# Patient Record
Sex: Male | Born: 1940 | Marital: Married
Health system: Southern US, Community
[De-identification: ages and names within clinical notes are randomized; demographics above are authoritative.]

---

## 2004-03-19 ENCOUNTER — Emergency Department: Payer: Self-pay | Admitting: General Practice

## 2004-04-18 ENCOUNTER — Ambulatory Visit: Payer: Self-pay

## 2004-04-26 ENCOUNTER — Ambulatory Visit: Payer: Self-pay | Admitting: Surgery

## 2005-09-17 ENCOUNTER — Ambulatory Visit: Payer: Self-pay | Admitting: Family Medicine

## 2007-06-22 ENCOUNTER — Emergency Department: Payer: Self-pay | Admitting: Emergency Medicine

## 2007-09-30 ENCOUNTER — Ambulatory Visit: Payer: Self-pay | Admitting: Family Medicine

## 2011-04-04 ENCOUNTER — Ambulatory Visit: Payer: Self-pay | Admitting: Internal Medicine

## 2011-04-04 LAB — CBC CANCER CENTER
Basophil #: 0 x10 3/mm (ref 0.0–0.1)
Basophil %: 0.6 %
Eosinophil #: 0.1 x10 3/mm (ref 0.0–0.7)
Eosinophil %: 2.9 %
HCT: 43.4 % (ref 40.0–52.0)
HGB: 14.4 g/dL (ref 13.0–18.0)
Lymphocyte #: 1.7 x10 3/mm (ref 1.0–3.6)
Lymphocyte %: 41.9 %
MCH: 29.5 pg (ref 26.0–34.0)
MCV: 89 fL (ref 80–100)
Monocyte #: 0.2 x10 3/mm (ref 0.0–0.7)
Monocyte %: 4.9 %
Neutrophil #: 2.1 x10 3/mm (ref 1.4–6.5)
Neutrophil %: 49.7 %
Platelet: 127 x10 3/mm — ABNORMAL LOW (ref 150–440)
RDW: 14.6 % — ABNORMAL HIGH (ref 11.5–14.5)
WBC: 4.1 x10 3/mm (ref 3.8–10.6)

## 2011-04-04 LAB — RETICULOCYTES: Absolute Retic Count: 0.082 10*6/uL (ref 0.024–0.084)

## 2011-04-04 LAB — IRON AND TIBC
Iron Bind.Cap.(Total): 318 ug/dL (ref 250–450)
Iron: 93 ug/dL (ref 65–175)
Unbound Iron-Bind.Cap.: 225 ug/dL

## 2011-04-04 LAB — LACTATE DEHYDROGENASE: LDH: 180 U/L (ref 87–241)

## 2011-04-04 LAB — FERRITIN: Ferritin (ARMC): 220 ng/mL (ref 8–388)

## 2011-04-08 ENCOUNTER — Ambulatory Visit: Payer: Self-pay | Admitting: Internal Medicine

## 2011-04-08 LAB — PROT IMMUNOELECTROPHORES(ARMC)

## 2012-04-28 ENCOUNTER — Inpatient Hospital Stay: Payer: Self-pay | Admitting: Internal Medicine

## 2012-04-28 LAB — CBC WITH DIFFERENTIAL/PLATELET
Basophil #: 0 10*3/uL (ref 0.0–0.1)
Basophil %: 0.6 %
Eosinophil %: 0.9 %
HGB: 15 g/dL (ref 13.0–18.0)
Lymphocyte #: 1.2 10*3/uL (ref 1.0–3.6)
Lymphocyte %: 14.6 %
MCH: 28.5 pg (ref 26.0–34.0)
Monocyte #: 0.5 x10 3/mm (ref 0.2–1.0)
Monocyte %: 6.3 %
Neutrophil #: 6.3 10*3/uL (ref 1.4–6.5)
Neutrophil %: 77.6 %
Platelet: 128 10*3/uL — ABNORMAL LOW (ref 150–440)

## 2012-04-28 LAB — URINALYSIS, COMPLETE
Bacteria: NONE SEEN
Bilirubin,UR: NEGATIVE
Glucose,UR: NEGATIVE mg/dL (ref 0–75)
Ketone: NEGATIVE
Nitrite: NEGATIVE
RBC,UR: 7 /HPF (ref 0–5)
Specific Gravity: 1.018 (ref 1.003–1.030)
WBC UR: 8 /HPF (ref 0–5)

## 2012-04-28 LAB — COMPREHENSIVE METABOLIC PANEL
Anion Gap: 5 — ABNORMAL LOW (ref 7–16)
Bilirubin,Total: 0.5 mg/dL (ref 0.2–1.0)
Calcium, Total: 9.1 mg/dL (ref 8.5–10.1)
Chloride: 102 mmol/L (ref 98–107)
Creatinine: 0.85 mg/dL (ref 0.60–1.30)
EGFR (African American): >60
EGFR (Non-African Amer.): >60
Glucose: 148 mg/dL — ABNORMAL HIGH (ref 65–99)
Osmolality: 274 (ref 275–301)
SGOT(AST): 48 U/L — ABNORMAL HIGH (ref 15–37)
SGPT (ALT): 25 U/L (ref 12–78)
Sodium: 135 mmol/L — ABNORMAL LOW (ref 136–145)
Total Protein: 7.3 g/dL (ref 6.4–8.2)

## 2012-04-28 LAB — CK TOTAL AND CKMB (NOT AT ARMC)
CK, Total: 1127 U/L — ABNORMAL HIGH (ref 35–232)
CK-MB: 9.9 ng/mL — ABNORMAL HIGH (ref 0.5–3.6)

## 2012-04-29 DIAGNOSIS — R7989 Other specified abnormal findings of blood chemistry: Secondary | ICD-10-CM

## 2012-04-29 DIAGNOSIS — I219 Acute myocardial infarction, unspecified: Secondary | ICD-10-CM

## 2012-04-29 LAB — LIPID PANEL
Cholesterol: 134 mg/dL (ref 0–200)
Ldl Cholesterol, Calc: 75 mg/dL (ref 0–100)
Triglycerides: 49 mg/dL (ref 0–200)
VLDL Cholesterol, Calc: 10 mg/dL (ref 5–40)

## 2012-04-29 LAB — HEMOGLOBIN A1C: Hemoglobin A1C: 6.3 % (ref 4.2–6.3)

## 2012-04-29 LAB — BASIC METABOLIC PANEL
Anion Gap: 7 (ref 7–16)
BUN: 15 mg/dL (ref 7–18)
Calcium, Total: 8.8 mg/dL (ref 8.5–10.1)
Co2: 25 mmol/L (ref 21–32)
Creatinine: 0.74 mg/dL (ref 0.60–1.30)
EGFR (Non-African Amer.): >60
Glucose: 118 mg/dL — ABNORMAL HIGH (ref 65–99)
Potassium: 3.6 mmol/L (ref 3.5–5.1)

## 2012-04-29 LAB — TROPONIN I: Troponin-I: 0.59 ng/mL — ABNORMAL HIGH

## 2012-04-29 LAB — CK TOTAL AND CKMB (NOT AT ARMC): CK-MB: 8.4 ng/mL — ABNORMAL HIGH (ref 0.5–3.6)

## 2012-04-29 LAB — CK: CK, Total: 1091 U/L — ABNORMAL HIGH (ref 35–232)

## 2012-04-29 LAB — MAGNESIUM: Magnesium: 1.4 mg/dL — ABNORMAL LOW

## 2012-04-30 ENCOUNTER — Ambulatory Visit: Payer: Self-pay | Admitting: Internal Medicine

## 2012-05-07 ENCOUNTER — Ambulatory Visit: Payer: Self-pay | Admitting: Internal Medicine

## 2012-06-07 DEATH — deceased

## 2014-04-29 NOTE — Discharge Summary (Signed)
PATIENT NAME:  Jason EspyJACOBS, Zyere MR#:  [id number] DATE OF BIRTH:  [birth year]  DATE OF ADMISSION:  04/28/2012 DATE OF DISCHARGE:  05/01/2012  ADMISSION DIAGNOSIS: Encephalopathy.   DISCHARGE DIAGNOSES:  1.  Acute encephalopathy, suspected medication versus of worsening of his underlying severe dementia.  2.  Elevated cardiac enzymes.  3.  Alzheimer dementia.  4.  Hypomagnesemia.  5.  Hyponatremia.   CONSULTATIONS:   1.  Dr. Toni Amendlapacs of psychiatry.  2.  Dr. Julien Nordmannimothy Gollan.  3.  Hospice.   LABORATORY AND RADIOLOGICAL DATA: Discharge magnesium 1.8, sodium 136, potassium 3.6, chloride 104, bicarbonate 25, BUN 15, creatinine 0.74, glucose 118.   A 2D echocardiogram showed EF of 50% to 55%, unable to exclude regional wall motion abnormalities.   Troponin max was 0.64, 0.59 at discharge.   HOSPITAL COURSE: A 74 year old male with severe dementia who presented with encephalopathy. For further details, please refer to the H and P. 1.  Acute encephalopathy: Initially this was suspected to be medication-induced, as the family endorses that his encephalopathy started when the patient was started on Haldol. However, I suspect that it is a combination of both the Haldol as well as just progressive dementia. We appreciate palliative care consult. The patient has not had any improvement in his mental status over the 4 days that he was in the hospital. He opens his eyes and is nonverbal at baseline. He is not eating as well, again I suspect due to his underlying dementia. The patient was referred to hospice, and the family did decide on home hospice. The patient will be discharged with home hospice. His initial CPK was slightly elevated. There was suspicion for neuroleptic malignant syndrome. However, after consultation with psychiatry, this is felt unlikely due to the CPK not being dramatically high and no hyperthermia.  2.  Elevated cardiac enzymes: Appreciate cardiology consult. This was due to malnutrition  and demand ischemia from his malnutrition. His echo was poor quality; however, there is no suspected acute coronary syndrome.  3.  Alzheimer dementia: The patient will continue his outpatient medications as tolerated.   DISCHARGE MEDICATIONS:  1.  Amantadine 10 mL b.i.d. 2.  Morphine 0.5 mL q.4 hours p.r.n. pain/agitation.  3.  Ativan 0.5 mg t.i.d. p.r.n. agitation.  DISCHARGE DIET: Regular as tolerated, mechanical soft.  DISCHARGE REFERRAL: Hospice.   DISCHARGE FOLLOWUP: The patient will follow up with hospice.   PROGNOSIS: The patient has guarded prognosis but is ready for discharge.   TIME SPENT: 35 minutes.   ____________________________ Janyth ContesSital P. Juliene PinaMody, MD spm:jm D: 05/01/2012 14:15:03 ET T: 05/01/2012 20:51:05 ET JOB#: [id number]  cc: Crawford Tamura P. Juliene PinaMody, MD, <Dictator> Janyth ContesSITAL P Fayth Trefry MD ELECTRONICALLY SIGNED 05/09/2012 13:24

## 2014-04-29 NOTE — H&P (Signed)
PATIENT NAME:  Jason Silva, Jason Silva MR#:  [id number] DATE OF BIRTH:  1940-12-16  DATE OF ADMISSION:  04/28/2012  PRIMARY CARE PHYSICIAN:  Dr. Angelique Holm   REFERRING PHYSICIAN:  Dr. Clemens Catholic   CHIEF COMPLAINT: Skin tear and blister today.   HISTORY OF PRESENT ILLNESS: The patient is a 74 year old African American male with a history of Alzheimer's dementia, sacral decubitus ulcer, was sent to the ED from home by his wife due to above chief complaint. The patient is severely demented, nonverbal and noncommunicative. According to the patient's wife, the patient was noted to have skin blister and a tear today on the left arm and buttock. In addition, the patient's wife mentioned that the patient has muscle rigidity for the past 2 weeks. The patient was started with Haldol p.r.n. for agitation 2 weeks ago, but since that time the patient's muscles became rigid and  he became bedbound. The patient's CK was noted to be elevated at 1127, troponin at 0.52. Was suspected to have NMS. Dr. Clemens Catholic, the ED physician, admitted patient for supportive care.   PAST MEDICAL HISTORY: Alzheimer's dementia, sacral decubitus ulcer.   SOCIAL HISTORY: The patient living with his wife. No smoking, no drinking or illicit drugs.   FAMILY HISTORY: Unknown.   PAST SURGICAL HISTORY: No.   ALLERGIES: No.  HOME MEDICATIONS: Metolazone 2.5 mg p.o. daily, Lasix 40 mg p.o. 2 tablets once a day, Klor-Con M 20 oral tablet 1 tablet 3 times daily, haloperidol 2 mg/mL 1 mL every 4 hours p.r.n., aspirin 81 mg p.o. daily, amantadine 50 mg/5 mL 10 mL twice a day.   REVIEW OF SYSTEMS: Unable to obtain due to patient's dementia status.   PHYSICAL EXAMINATION: VITAL SIGNS: Temperature 98.7, blood pressure 141/84, pulse 73, respiration 18, oxygen saturation 99% on room air.  GENERAL: This patient is awake, but demented, unable to communicate, in no acute distress.  HEENT: Pupils are round, pinpoint, about 1 mm in diameter, not reactive  to light. No discharge from ear or nose and unable to do oral exam.  NECK: Supple. No JVD or carotid bruits. No lymphadenopathy. No thyromegaly.  CARDIOVASCULAR: S1, S2. Regular rate and rhythm. No murmurs or gallop.  PULMONARY: Bilateral air entry. No wheezing or rales. No use of accessory muscles to breathe.  ABDOMEN: Soft. No distention and no organomegaly. Bowel sounds present.  EXTREMITIES: No edema, clubbing or cyanosis. No calf tenderness. Strong bilateral pedal pulses.  SKIN: No rash or jaundice. There is a skin tear on the upper part of left arm, about 2 cm in length. There is another skin tear on the left side of buttock, about 2 cm long.  No bleeding. Sacral decubitus ulcer, stage II.    NEUROLOGY: Unable to examine due to patient's dementia status.   LABORATORY, DIAGNOSTIC AND RADIOLGIC DATA:  Glucose 184, BUN 16, creatinine 0.85, sodium 135, potassium 4.4, chloride 102, bicarbonate 28, CK 1127, CK-MB 9.9. Troponin 0.52.  EKG showed normal sinus rhythm at 90 beats per minute with prolonged QT.   IMPRESSION: 1.  Non-ST segment elevation myocardial infarction. 2.  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  3.  Hyponatremia.  4.  Thrombocytopenia.  5.  Alzheimer's dementia.  6.  Sacral decubitus ulcer, stage II.   PLAN OF TREATMENT:  1.  The patient will be admitted to telemetry floor. We will follow up troponin level. We will start aspirin 325 mg p.o. daily. Give Lovenox 1 mg/kg q.12 hours and start Lipitor and Lopressor. In addition, we will get  a cardiology consult.  2.  For possible NMS, we will hold haloperidol and start normal saline IV. Follow up with CK,  BMP, and a CBC.  3.  We will get a swallowing study.  4.  We will get wound care for sacral decubitus ulcer.  5.  Gastrointestinal and deep vein thrombosis prophylaxis.   Discussed the patient's condition and the plan of treatment with the patient, wife, and other family member. Also, I asked about patient's CODE STATUS.  Initially, patient's wife wants the patient to be placed on DO NOT RESUSCITATE status, but since other member does not agree, so patient's wife decided that the patient IS FULL CODE NOW and they will discuss with each other and then decide whether patient should be placed on DO NOT RESUSCITATE status.   TIME SPENT: About 57 minutes.    ____________________________ Shaune PollackQing Cristofher Livecchi, MD qc:cc D: 04/28/2012 15:49:33 ET T: 04/28/2012 16:56:33 ET JOB#: [id number]  cc: Shaune PollackQing Sukhman Martine, MD, <Dictator> Shaune PollackQING Flynn Lininger MD ELECTRONICALLY SIGNED 04/28/2012 19:57

## 2014-04-29 NOTE — Consult Note (Signed)
PATIENT NAME:  Jason Silva, Jason MR#:  [id number] DATE OF BIRTH:  [birth year]  DATE OF CONSULTATION:  04/29/2012  REFERRING PHYSICIAN:   CONSULTING PHYSICIAN:  Audery AmelJohn T. Woodrow Dulski, MD  IDENTIFYING INFORMATION AND REASON FOR CONSULT: The patient is a 74 year old man with a history of dementia, new presentation, with stiffness. Consultation for possible neuroleptic malignant syndrome.   HISTORY OF PRESENT ILLNESS: Information obtained from the family and from the chart. The patient is not able to talk or give any history. Apparently the patient had a new onset of stiffness and of skin problems in the last week or so. The chart indicates that the stiffness was a major concern, although when I spoke to the patient's family in the room they stated that their main concern about him was that his skin was peeling. The patient appears to have been severely demented with minimal responsiveness for quite some time. Apparently he had been prescribed haloperidol for treatment of agitation related to dementia. The patient's family do point out to me that his limbs have been stiff recently, more noticeable than usual. It has been more difficult to physically manipulate him. They do not report to me any other specific symptoms at this point. As I mentioned, the patient is not able to give any history.   PAST PSYCHIATRIC HISTORY: The patient has been identified as demented for quite a while. Does not appear to have any other relevant mental health history.   PAST MEDICAL HISTORY: History of dyslipidemia, hypertension, gastric reflux symptoms and advanced dementia.   SOCIAL HISTORY: The patient is evidently living at home with his wife.   REVIEW OF SYSTEMS: The patient is not able to offer any history. Chief complaints from the family are skin peeling, bruising and increased stiffness.   MENTAL STATUS EXAMINATION: The patient is nonresponsive. His eyes are open. Difficult to assess whether or not he is awake. Seems to have  very advanced dementia. On evaluation of his upper arms, I found that both of them were stiff. There was not lead-pipe level rigidity. I was able to bend his arms at the shoulder and at the elbow and the wrist without any fear that I was injuring him or using excessive force; however, they are stiff.  Limbs tends to bounce back to their previous position after being bent in a slow manner. I was not able to detect any cogwheel rigidity.   PHYSICAL EXAMINATION:  Most notable here for his vital signs. Temperature has been normal throughout his hospital stay. Pulse peaked at 109, otherwise has been normal. Blood pressure has been in the normal range.   ASSESSMENT: A 74 year old man who does present with stiffness, also some kind of skin peeling, although I did not appreciate this on my exam with him. Recent use of haloperidol.  The patient currently is afebrile, does not have a significantly labile blood pressure or pulse.  He is rigid but does not have lead-pipe rigidity. On review of lab tests, his white blood cell count was normal. He was not showing signs of acute renal failure. CK peaked at 1336.  All and all this is not a picture typical of neuroleptic malignant syndrome. There is not a single gold standard diagnosis for neuroleptic malignant syndrome, which can present as a cluster symptoms, however hyperthermia is almost universal. Other almost universal features are unstable and labile blood pressure and pulse, profound sweating, lead-pipe rigidity and elevated white blood cell count. The patient really has none of these. He does have an  elevated CK, but I see that he has been given a diagnosis of a myocardial infarction. His CK peaked at 1336.  Descriptions of neuroleptic malignant syndrome usually states that 1000 is about the minimum that you will see and often you see an order of magnitude above that for neuroleptic malignant syndrome. It is possible that he could be having a dystonic reaction from the  haloperidol. Although his presentation does not look very typical for that, it is hard to tell without him being able to give me history. I do not appreciate any typical ratcheting to his stiffness.   RECOMMENDATIONS:  I doubt that this is neuroleptic malignant syndrome. If it is, it is extraordinarily mild neuroleptic malignant syndrome. The primary treatment for neuroleptic malignant syndrome is discontinuing antipsychotics and supportive therapy. Other medications such as dantrolene are experimental and are usually reserved for very severe cases. They have risks of their own side effects with them.  I do not think that his current condition would suggest doing any of that. I would discontinue antipsychotics. If he were having a typical extrapyramidal reaction to Haldol, the treatment would be Benadryl or Cogentin typically. However, if he were having that sort of reaction you would expect that it would go away in a pretty short period of time after stopping the neuroleptics anyway. Those other medications have their own risks including making confusion and dementia worse. Overall, I do not think I would suggest adding any medications, but I would discontinue dopamine blocking medicines.   DIAGNOSIS, PRINCIPAL AND PRIMARY:   AXIS I: Dementia, severe, presumably Alzheimer's type with vascular as well.   SECONDARY DIAGNOSES:  AXIS II: No diagnosis.   AXIS III: Myocardial infarction, hypertension.   AXIS IV: Hard to assess stress in somebody this is disabled in the first place.   AXIS V: Functioning at time of evaluation 10.   ____________________________ Audery Amel, MD jtc:sb D: 04/30/2012 12:38:00 ET T: 04/30/2012 12:59:11 ET JOB#: [id number]  cc: Audery Amel, MD, <Dictator> Audery Amel MD ELECTRONICALLY SIGNED 04/30/2012 15:09

## 2014-04-29 NOTE — Consult Note (Signed)
Brief Consult Note: Diagnosis: dementia alzheimers type severe.   Patient was seen by consultant.   Consult note dictated.   Comments: Psychiatry: Patient seen and examined. Chart reviewed.Concern about NMS. Patient is stiff but not lead pipe stiff. Not febrile. Slightly tachycardic but BP stable. WBC nml. CPK up around 1000 but pt also has dx of MI. NMS almost always involves severe hyperthermia. CPK usually much >1000. There is no gold standard for NMS dx but the overall picture here does not look like NMS. Nevertheless I agree with holding anti-domiminergic meds. Tx for NMS is largely supportive with meds of unclear benefit and usually reserved for severe and clear-cut cases. Would not advise use of any specific meds to treat pt for NMS.  Electronic Signatures: Mizraim Harmening, Jackquline DenmarkJohn T (MD)  (Signed 23-Apr-14 14:27)  Authored: Brief Consult Note   Last Updated: 23-Apr-14 14:27 by Audery Amellapacs, Monta Maiorana T (MD)

## 2014-04-29 NOTE — Consult Note (Signed)
Comments   Dr Phifer and I met with pt's wife and daughter. Updated family on pt's status. They recognize that he may not recover from this illness. We also talked about pt's inability to consume oral nutrition in his current state. Pt has a living will, and per family, pt would not want to be sustained in a futile state. Wife is not interested in artificial feeding. Family says that patient should be a DNR per his wishes. We talked about hospice services and wife wants to take patient home with hospice. Will order screening.  DNRHospice screening 20 minutes  Electronic Signatures for Addendum Section:  Phifer, Nancy (MD) (Signed Addendum 24-Apr-14 19:40)  Josh Borders, NP, and I met with pt's wife and daughter. Agree with assessment and plan as outlined in above note. Family wants pt to return home. They understand that he is in the late stages of Alzheimers and are agreeable with home hospice. Pt is a DNR.   Electronic Signatures: Borders, Joshua R (NP)  (Signed 24-Apr-14 15:03)  Authored: Palliative Care   Last Updated: 24-Apr-14 19:40 by Phifer, Nancy (MD) 

## 2014-04-29 NOTE — Consult Note (Signed)
General Aspect Jason Silva is a 74 yo African-American male with no prior cardiac history, PMHx s/f Alzheimer's dementia and sacral decubitus ulcers from bedbound status who was admitted to [facility] yesterday for skin tear and two week history of rigidity/spasticity. Cardiology was consulted for elevated cardiac enz.  The patient is demented, somnolent and noncommunicative. The history was provided by his brother and sister in the room. He has had no prior cardiac issues- no MI/cath/stress tests. No family history of CAD. 20+ pack-year tob history, quit 10 years ago. No recent complaints of chest pain or shortness of breath. They have noticed increased leg swelling. No PND, orthopnea, palpitations or syncope. He is able to walk and feed himself at baseline. Due to Alzheimer's, he does not speak. Over the past two weeks, his energy level and appetite have declined. He has been bedbound and more rigid. He was apparently recently started on Haldol for agitation. He developed a left arm and buttock skin tear, and was transported to the ED.   Present Illness There, EKG revealed no evidence of ischemia. Initial trop-I mildly elevated at 0.52. CK 1127, CK-MB 9.9. BMET unremarkable. LFT- AST 48, AP 140. CBC- PLT 128, otherwise normal. U/a- 2+ LEs. Noncon head CT- increased cerebral atrophy, ventriculomegaly, no acute process. CXR w/o acute disease. 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 Haldol has been held. Two subsequent readings of trop-I returned at 0.64->0.59. He is being hydrated- CK remaining stable.  PAST MEDICAL HISTORY: Alzheimer's dementia, sacral decubitus ulcer.   SOCIAL HISTORY: The patient living with his wife. 20+ pack-year tobacco history, quit 10 years ago. No drinking or illicit drugs.   FAMILY HISTORY: Unknown.   PAST SURGICAL HISTORY: No.   ALLERGIES: No.   Physical Exam:  GEN no acute distress, cachectic   HEENT red conjunctivae   NECK supple  No masses  trachea  midline   RESP normal resp effort  clear BS  no use of accessory muscles   CARD Regular rate and rhythm  Normal, S1, S2  No murmur   ABD denies tenderness  soft  normal BS   EXTR negative cyanosis/clubbing, trace bilateral pretibial edema   SKIN normal to palpation   NEURO spastic, unable to assess motor/sensory function or strength   PSYCH stuporous   Review of Systems:  Subjective/Chief Complaint rigidity   General: Weight loss or gain   Respiratory: No Complaints   Cardiovascular: No Complaints   ROS Pt not able to provide ROS  ROS limited due to stuporous mentation, provided by relatives     Alzheimer's Disease:    Denies medical history:    Denies surgical history.:     Aspirin Enteric Coated tablet, ( Ecotrin)  325 mg Oral daily  - Indication: Pain/Fever/Thromboembolic Disorders/Post MI/Prophylaxis MI  Instructions:  Initiate Bleeding Precautions Protocol--DO NOT CRUSH, 28-Apr-2012, Active, Standard   atorvaSTATin tablet, 20 mg Oral at bedtime  - Indication: Hypercholesterolemia, 28-Apr-2012, Active, Standard   Enoxaparin injection, ( Lovenox injection )  75 mg, Subcutaneous, q12h  Indication: Prophylaxis or treatment of thromboembolic disorders, Monitor Anticoags per hospital protocol, 28-Apr-2012, Active, Standard   meTOProlol tartrate tablet, ( Lopressor)  25 mg Oral q6h  - Indication: Antihyperensive/ Angina  Instructions:  HOLD if HR <50, SBP <100, 28-Apr-2012, Active, Standard   Nitroglycerin tablet, ( Nitrostat SL)  0.4 mg Sublingual Q5M PRN for chest pain  - Indication: Angina/ Hypertension  Instructions:  q 5 minutes x 3 doses PRN, 28-Apr-2012, Active,  Standard   Acetaminophen * tablet, ( Tylenol (325 mg) tablet)  650 mg Oral q4h PRN for pain or temp. greater than 100.4  - Indication: Pain/Fever, 28-Apr-2012, Active, Standard   MorphINE  injection, 2 to 4 mg, IV push, q4h PRN for pain  Indication: Pain, [Med Admin Window: 30 mins before or  after scheduled dose], 28-Apr-2012, Active, Standard   Ondansetron injection, ( Zofran injection )  4 mg, IV push, q4h PRN for Nausea/Vomiting  Indication: Nausea/ Vomiting, 28-Apr-2012, Active, Standard   Pantoprazole tablet, 40 mg Oral q6am  - Indication: Erosive Esophagitis/ GERD  Instructions:  DO NOT CRUSH, 28-Apr-2012, Active, Standard   Amantadine 50 mg/5 ml syrup, 100 mg Oral q12h  -Indication:Anti-Parkinsons/ Anti-viral, Excessive bleeding, 28-Apr-2012, Active, Standard   Metolazone tablet, ( Zaroxolyn)  2.5 mg Oral daily  - Indication: Antihyperensive/ Diuretic, 28-Apr-2012, Active, Standard   Pneumococcal 23-valent Vaccine, 0.5 ml, Intramuscular, atdischarge  Indication: Pneumococcal Immunization, 0.41m IM once (Stored in [facility], 28-Apr-2012, Active, Standard   Furosemide injection,  ( Lasix injection )  40 mg, IV push, once  Indication: Diuresis, 29-Apr-2012, Completed, Standard   Magnesium Sulfate injection, 2 gram in Sterile Water 50 ml, IV Piggyback, once, Infuse over 240 minute(s)  Indication: Magnesium Deficiency/ Pre-Eclampsia/ Eclampsia, 29-Apr-2012, Completed, Standard  Home Medications: Medication Instructions Status  Klor-Con M20 oral tablet, extended release 1 tab(s) orally 3 times a day Active  metolazone 2.5 mg oral tablet 1 tab(s) orally once a day Active  Lasix 40 mg oral tablet 2 tab(s) orally once a day Active  aspirin 81 mg oral tablet 1 tab(s) orally once a day Active  haloperidol 2 mg/mL oral concentrate 1 milliliter(s) orally every 4 hours, As Needed Active  amantadine 50 mg/5 mL oral syrup 10 milliliter(s) orally 2 times a day Active   Lab Results:  Hepatic:  22-Apr-14 11:58   Bilirubin, Total 0.5  Alkaline Phosphatase  140  SGPT (ALT) 25  SGOT (AST)  48  Total Protein, Serum 7.3  Albumin, Serum  3.3  Cardiology:  22-Apr-14 11:33   Ventricular Rate 90  Atrial Rate 90  P-R Interval 170  QRS Duration 94  QT 412  QTc 504   P Axis 63  R Axis 61  T Axis 62  ECG interpretation Normal sinus rhythm Prolonged QT Abnormal ECG When compared with ECG of 18-Apr-2004 13:17, Vent. rate has increased BY  41 BPM QT has lengthened ----------unconfirmed---------- Confirmed by OVERREAD, NOT ([id number]), editor PEARSON, BARBARA ([id number] on 04/28/2012 1:33:56 PM  Routine Chem:  22-Apr-14 11:58   Result Comment TOTAL CK - RESULT OBTAINED BY DILUTION  Result(s) reported on 28 Apr 2012 at 02:15PM.  Result Comment TROPONIN - RESULTS VERIFIED BY REPEAT TESTING.  - C/LUNA [id number]  - READ-BACK PROCESS PERFORMED.  Result(s) reported on 28 Apr 2012 at 02:21PM.  Glucose, Serum  148  BUN 16  Creatinine (comp) 0.85  Sodium, Serum  135  Potassium, Serum 4.4  Chloride, Serum 102  CO2, Serum 28  Calcium (Total), Serum 9.1  Anion Gap  5  Osmolality (calc) 274  eGFR (African American) >60  eGFR (Non-African American) >60 (eGFR values <644mmin/1.73 m2 may be an indication of chronic kidney disease (CKD). Calculated eGFR is useful in patients with stable renal function. The eGFR calculation will not be reliable in acutely ill patients when serum creatinine is changing rapidly. It is not useful in  patients on dialysis. The eGFR calculation may not be applicable to patients at the low and  high extremes of body sizes, pregnant women, and vegetarians.)    18:48   Result Comment troponin - RESULTS VERIFIED BY REPEAT TESTING.  - previously called '@14' :15 04-28-12 by VKB  - LJW  Result(s) reported on 28 Apr 2012 at 08:15PM.  Result Comment CK-MB - CANCELED DUPLICATE TEST MPG 3/81/77  Result(s) reported on 28 Apr 2012 at 07:36PM.  23-Apr-14 04:03   Result Comment TROPONIN - RESULTS VERIFIED BY REPEAT TESTING.  - PREV CALLED 4/22/14AT [id number]  Result(s) reported on 29 Apr 2012 at 05:28AM.  Glucose, Serum  118  BUN 15  Creatinine (comp) 0.74  Sodium, Serum 136  Potassium, Serum 3.6  Chloride, Serum 104  CO2,  Serum 25  Calcium (Total), Serum 8.8  Anion Gap 7  Osmolality (calc) 274  eGFR (African American) >60  eGFR (Non-African American) >60 (eGFR values <38m/min/1.73 m2 may be an indication of chronic kidney disease (CKD). Calculated eGFR is useful in patients with stable renal function. The eGFR calculation will not be reliable in acutely ill patients when serum creatinine is changing rapidly. It is not useful in  patients on dialysis. The eGFR calculation may not be applicable to patients at the low and high extremes of body sizes, pregnant women, and vegetarians.)  Magnesium, Serum  1.4 (1.8-2.4 THERAPEUTIC RANGE: 4-7 mg/dL TOXIC: > 10 mg/dL  -----------------------)  Hemoglobin A1c ([facility]) 6.3 (The American Diabetes Association recommends that a primary goal of therapy should be <7% and that physicians should reevaluate the treatment regimen in patients with HbA1c values consistently >8%.)  Cholesterol, Serum 134  Triglycerides, Serum 49  HDL (INHOUSE) 49  VLDL Cholesterol Calculated 10  LDL Cholesterol Calculated 75 (Result(s) reported on 29 Apr 2012 at 05:23AM.)  Cardiac:  22-Apr-14 11:58   Troponin I  0.52 (0.00-0.05 0.05 ng/mL or less: NEGATIVE  Repeat testing in 3-6 hrs  if clinically indicated. >0.05 ng/mL: POTENTIAL  MYOCARDIAL INJURY. Repeat  testing in 3-6 hrs if  clinically indicated. NOTE: An increase or decrease  of 30% or more on serial  testing suggests a  clinically important change)  CK, Total  1127  CPK-MB, Serum  9.9    18:48   Troponin I  0.64 (0.00-0.05 0.05 ng/mL or less: NEGATIVE  Repeat testing in 3-6 hrs  if clinically indicated. >0.05 ng/mL: POTENTIAL  MYOCARDIAL INJURY. Repeat  testing in 3-6 hrs if  clinically indicated. NOTE: An increase or decrease  of 30% or more on serial  testing suggests a  clinically important change)  CK, Total  1336  CPK-MB, Serum  12.1 (Result(s) reported on 28 Apr 2012 at 08:15PM.)  CPK-MB, Serum -   23-Apr-14 04:03   Troponin I  0.59 (0.00-0.05 0.05 ng/mL or less: NEGATIVE  Repeat testing in 3-6 hrs  if clinically indicated. >0.05 ng/mL: POTENTIAL  MYOCARDIAL INJURY. Repeat  testing in 3-6 hrs if  clinically indicated. NOTE: An increase or decrease  of 30% or more on serial  testing suggests a  clinically important change)  CK, Total  1120  CK, Total  1091 (Result(s) reported on 29 Apr 2012 at 0[facility])  CPK-MB, Serum  8.4 (Result(s) reported on 29 Apr 2012 at 05:28AM.)  Routine UA:  22-Apr-14 14:46   Color (UA) Yellow  Clarity (UA) Hazy  Glucose (UA) Negative  Bilirubin (UA) Negative  Ketones (UA) Negative  Specific Gravity (UA) 1.018  Blood (UA) 1+  pH (UA) 5.0  Protein (UA) Negative  Nitrite (UA) Negative  Leukocyte Esterase (UA) 2+ (Result(s)  reported on 28 Apr 2012 at 03:01PM.)  RBC (UA) 7 /HPF  WBC (UA) 8 /HPF  Bacteria (UA) NONE SEEN  Epithelial Cells (UA) 1 /HPF  Mucous (UA) PRESENT (Result(s) reported on 28 Apr 2012 at 03:01PM.)  Routine Hem:  22-Apr-14 11:58   WBC (CBC) 8.1  RBC (CBC) 5.26  Hemoglobin (CBC) 15.0  Hematocrit (CBC) 46.1  Platelet Count (CBC)  128  MCV 88  MCH 28.5  MCHC 32.4  RDW 13.6  Neutrophil % 77.6  Lymphocyte % 14.6  Monocyte % 6.3  Eosinophil % 0.9  Basophil % 0.6  Neutrophil # 6.3  Lymphocyte # 1.2  Monocyte # 0.5  Eosinophil # 0.1  Basophil # 0.0 (Result(s) reported on 28 Apr 2012 at 01:53PM.)   EKG:  Interpretation EKG shows NSR, no ST/T changes, good R wave progression   Rate 90   Additional Comments No prior tracing for comparison   Radiology Results:  XRay:    23-Apr-14 03:37, Chest Portable Single View  Chest Portable Single View   REASON FOR EXAM:    Dyspnea  COMMENTS:       PROCEDURE: DXR - DXR PORTABLE CHEST SINGLE VIEW  - Apr 29 2012  3:37AM     RESULT: Comparison: None    Findings:     Single portable AP chest radiograph is provided.  There is no focal   parenchymal opacity, pleural  effusion, or pneumothorax. Normal   cardiomediastinal silhouette. The osseous structures are unremarkable.    IMPRESSION:   No acute disease of the chest.    Dictation Site: 1        Verified By: Jennette Banker, M.D., MD    No Known Allergies:   Vital Signs/Nurse's Notes:  **Vital Signs.:   23-Apr-14 07:50  Temperature Temperature (F) 97.8  Celsius 36.5  Pulse Pulse 101  Respirations Respirations 18  Systolic BP Systolic BP 846  Diastolic BP (mmHg) Diastolic BP (mmHg) 81  Mean BP 93  Pulse Ox % Pulse Ox % 94  Oxygen Delivery Room Air/ 21 %  *Intake and Output.:   23-Apr-14 01:51  Grand Totals Intake:  0 Output:  0    Net:  0 24 Hr.:  0  Unmeasured Output  Void; incontinent    04:22  Grand Totals Intake:  0 Output:  0    Net:  0 24 Hr.:  0  Weight Type daily  Weight Method Bed  Current Weight (lbs) (lbs) 161  Current Weight (kg) (kg) 73  Height Type actual  Height (ft) (feet) 6  Height (in) (in) 2  Height (cm) centimeters 187.9  BSA (m2) 1.9  BMI (kg/m2) 20.6    07:00  Grand Totals Intake:  0 Output:  0    Net:  0 24 Hr.:  0  Oral Intake      In:  0    07:20  Grand Totals Intake:  0 Output:  0    Net:  0 24 Hr.:  0  Urinary Method  Void; Incontinent; lg. amount    Impression Jason Silva is a 74 yo African-American male with no prior cardiac history, PMHx s/f Alzheimer's dementia and sacral decubitus ulcers from bedbound status who was admitted to [facility] yesterday for skin tear and two week history of rigidity/spasticity.  1. Elevated troponin In the setting of #2/3. Serial troponins have returned mildly elevated, and plateued. No prior cardiac history. No recent endorsement of chest discomfort or shortness of breath, however, family admits given  his baseline severe dementia, it is difficult to determine new onset of symptoms. EKG is essentially normal.  Suspect troponemia may be related to myopathy with elevated CK on admission. Could be a component of  CHF (however fairly euvolemic on exam) or demand ischemia in the setting of acute illness/skin tears. He has had some bilateral LE swelling at home. Currently with no edema. Not dyspneic, tachycardic or hypoxic.  -- Hold on further ischemic eval -- Will order 2D echo -- D/c ACS dose Lovenox, change to DVT prophy anticoag  2. 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  -- Haldol being held by the medicine team.  -- Continue IVF hydration for myopathy  3. Malnutrition Cachectic on exam. Weight loss endorsed by family.  -- Consider nutrition consult  4. Hyponatremia, mild 5. Thrombocytopenia 6. Alzheimer's dementia 7. Skin tears Futher management per primary team.   Electronic Signatures: Tiasia Weberg A (PA-C)  (Signed 23-Apr-14 10:24)  Authored: General Aspect/Present Illness, History and Physical Exam, Review of System, Past Medical History, Orders, Home Medications, Labs, EKG , Radiology, Allergies, Vital Signs/Nurse's Notes, Impression/Plan Ida Rogue (MD)  (Signed 23-Apr-14 16:51)  Authored: General Aspect/Present Illness, EKG , Radiology, Vital Signs/Nurse's Notes, Impression/Plan  Co-Signer: General Aspect/Present Illness, History and Physical Exam, Review of System, Past Medical History, Orders, Home Medications, Labs, EKG , Radiology, Allergies, Vital Signs/Nurse's Notes, Impression/Plan   Last Updated: 23-Apr-14 16:51 by Ida Rogue (MD)

## 2014-11-19 IMAGING — CT CT HEAD WITHOUT CONTRAST
1 series · 15 of 30 positions shown, 19 images · non-contrast
Comparison: none

REASON FOR EXAM: not walking
COMMENTS:

[Series 2: soft tissue · axial · 0.41mm/px · z∈[-180,-36]mm · 15 of 33 slices shown, 19 images]
[im 2/33  brain]
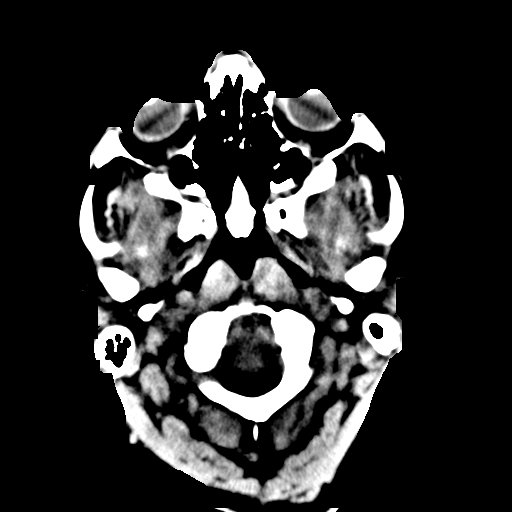
[im 2/33  bone]
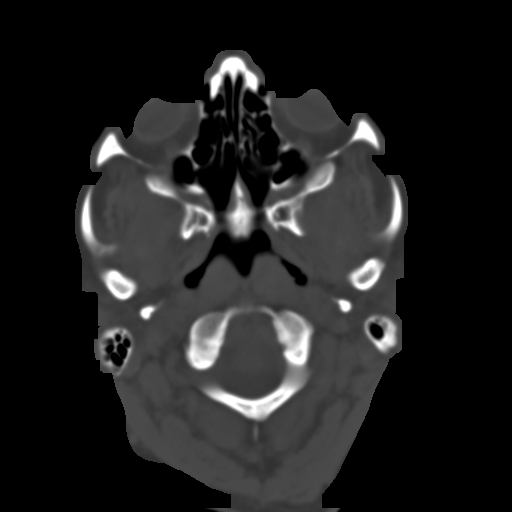
[im 4/33  brain]
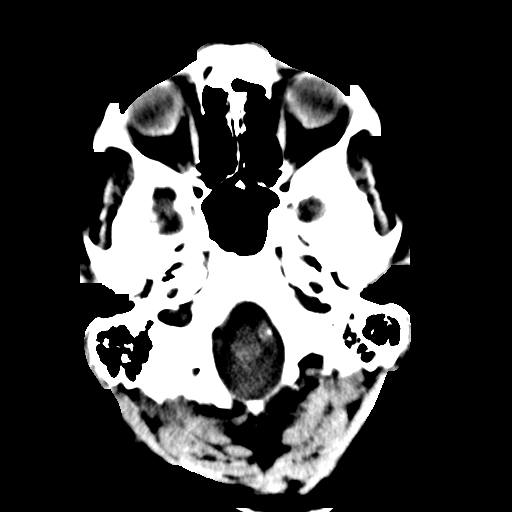
[im 6/33  brain]
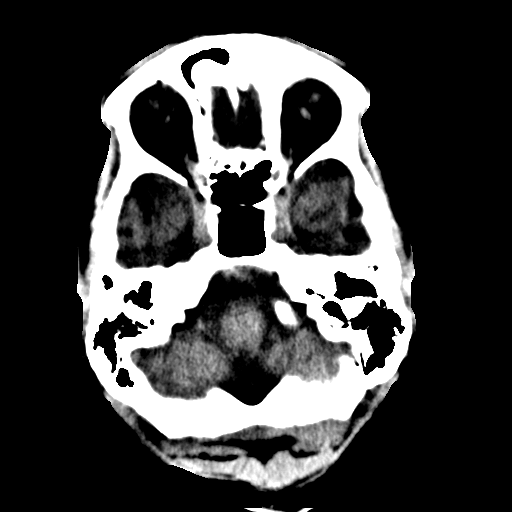
[im 8/33  brain]
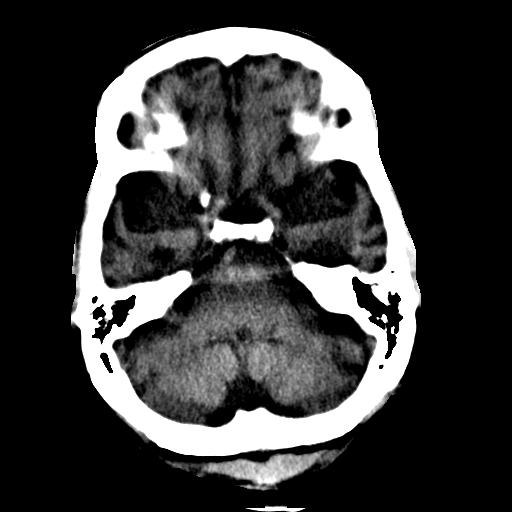
[im 10/33  brain]
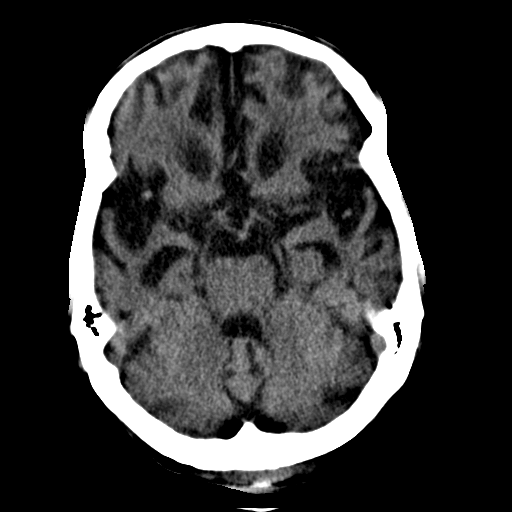
[im 10/33  bone]
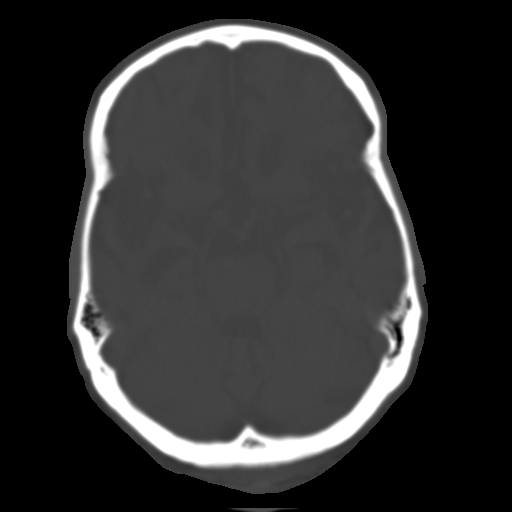
[im 13/33  brain]
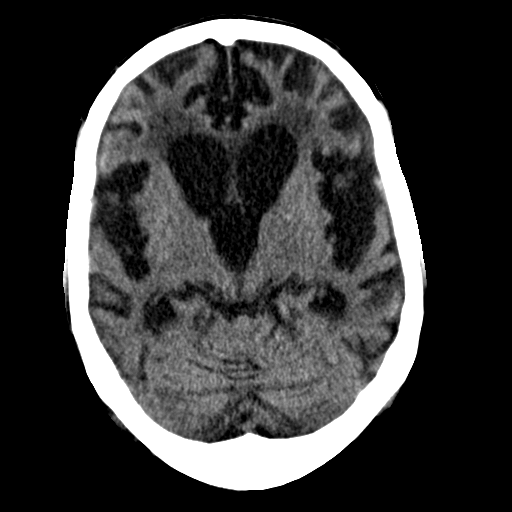
[im 15/33  brain]
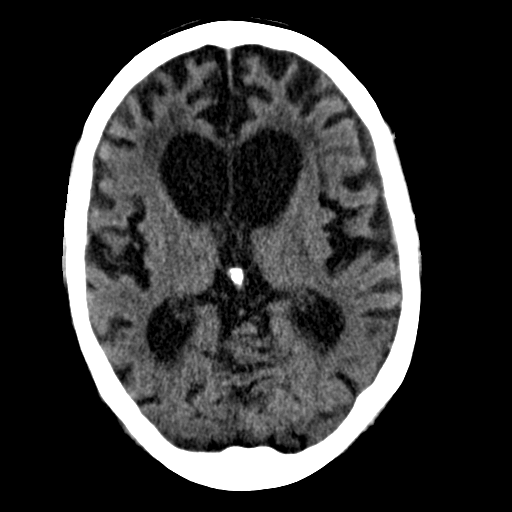
[im 17/33  brain]
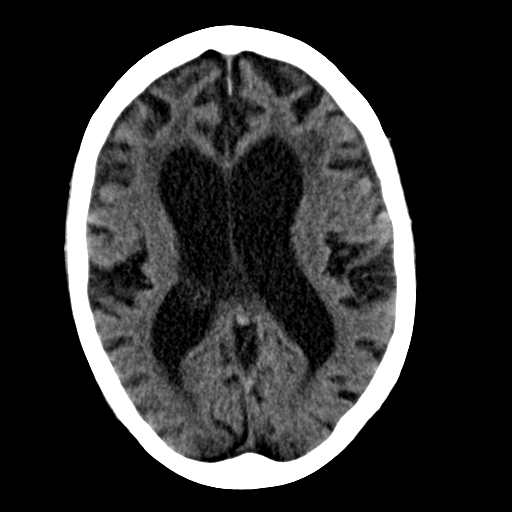
[im 18/33  brain]
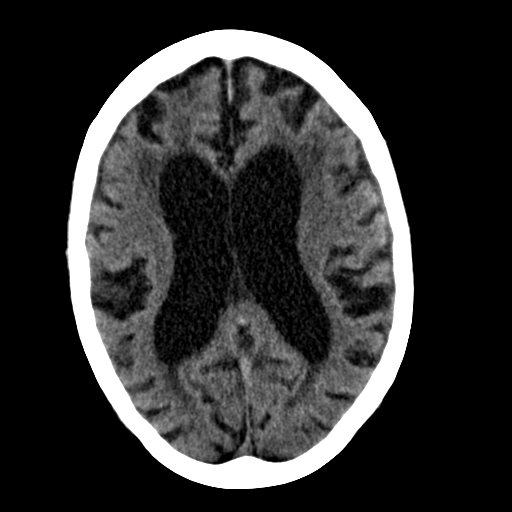
[im 18/33  bone]
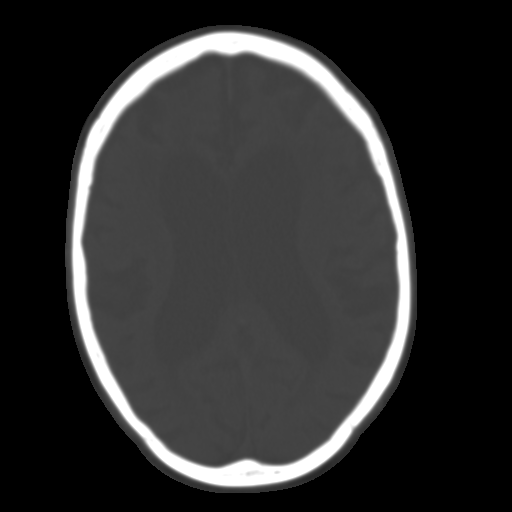
[im 20/33  brain]
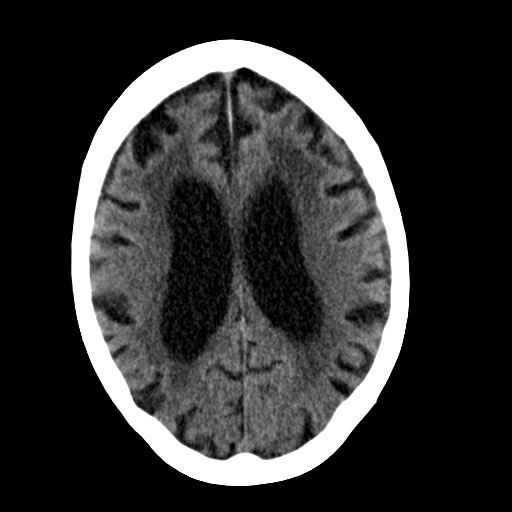
[im 23/33  brain]
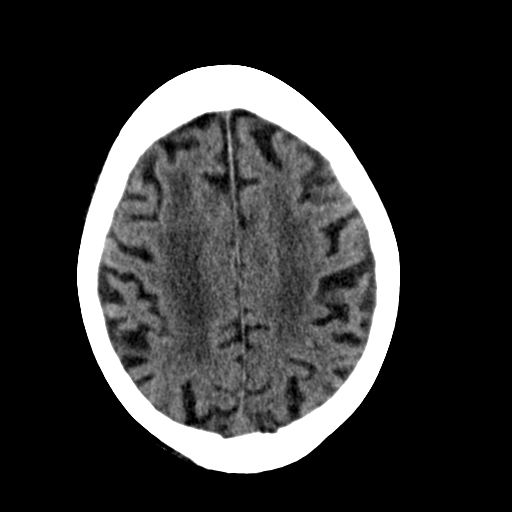
[im 25/33  brain]
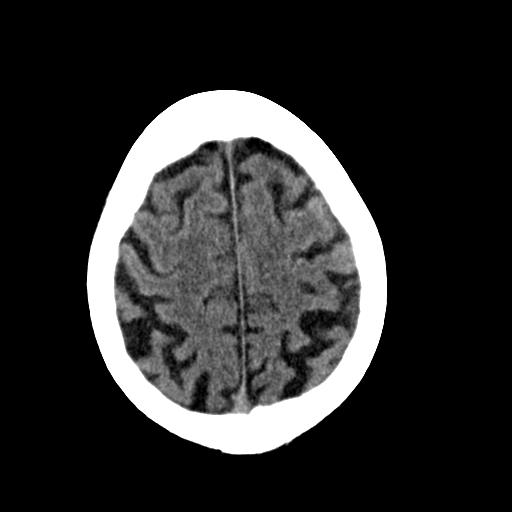
[im 27/33  brain]
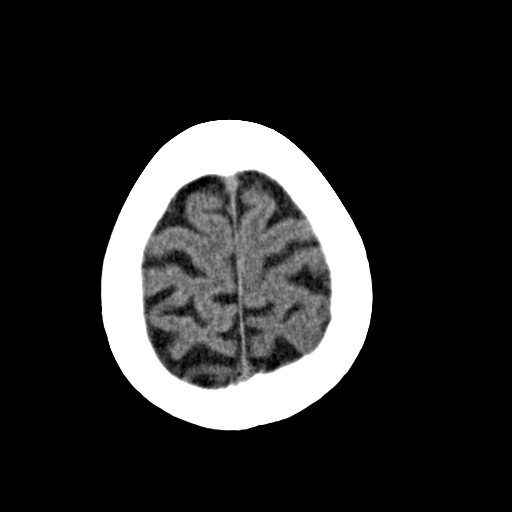
[im 27/33  bone]
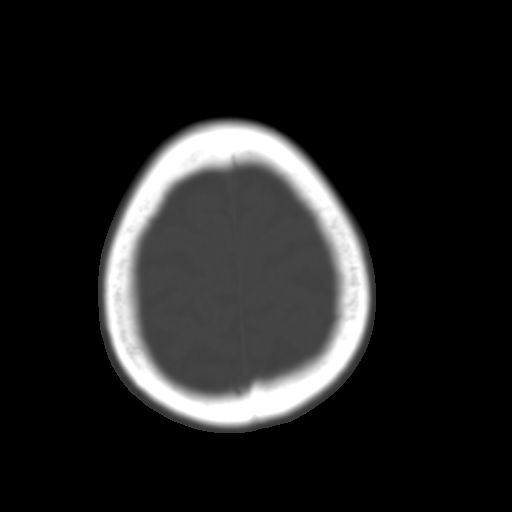
[im 29/33  brain]
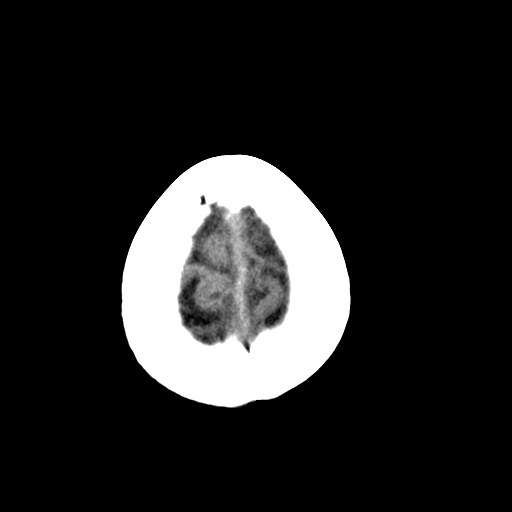
[im 31/33  brain]
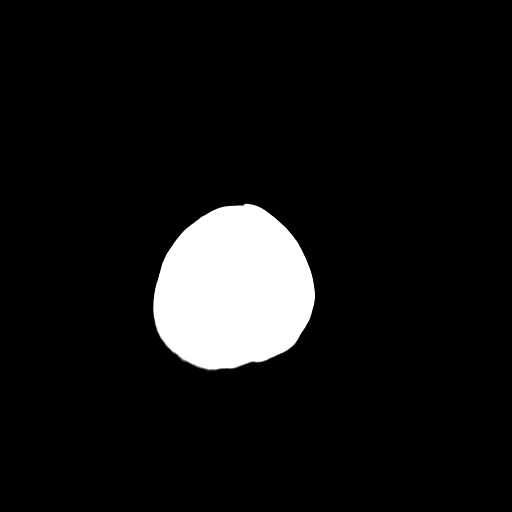

[15 of 30 positions shown; findings below may reference images not displayed]

PROCEDURE:     CT  - CT HEAD WITHOUT CONTRAST  - April 28, 2012  [DATE]

RESULT:     Axial noncontrast CT scanning was performed through the brain
with reconstructions at 5 mm intervals and slice thicknesses.

There is moderate diffuse cerebral and cerebellar atrophy with moderate to
severe ventriculomegaly. There is no shift of the midline. There is no
intracranial hemorrhage nor intracranial mass effect. There is decreased
density in the deep white matter of both cerebral hemispheres consistent
with chronic small vessel ischemic type change. There is no evidence of an
acute ischemic event.

At bone window settings the observed portions of the paranasal sinuses and
mastoid air cells are clear. There is no evidence of an acute skull fracture.
IMPRESSION: There is moderate diffuse cerebral and cerebellar atrophy
which has progressed somewhat since the MRI of 5447. There is moderate to
severe ventriculomegaly which has also worsened since the previous study.
There is no evidence of an acute intracranial hemorrhage nor of an
intracranial mass nor of an evolving ischemic infarction.

[REDACTED]

## 2014-11-20 IMAGING — CR DG CHEST 1V PORT
1 series · 1 of 1 positions shown · non-contrast
Comparison: none

REASON FOR EXAM: Dyspnea
COMMENTS:

PROCEDURE:     DXR - DXR PORTABLE CHEST SINGLE VIEW  - April 29, 2012  [DATE]
RESULT:     Comparison: None

[ap]
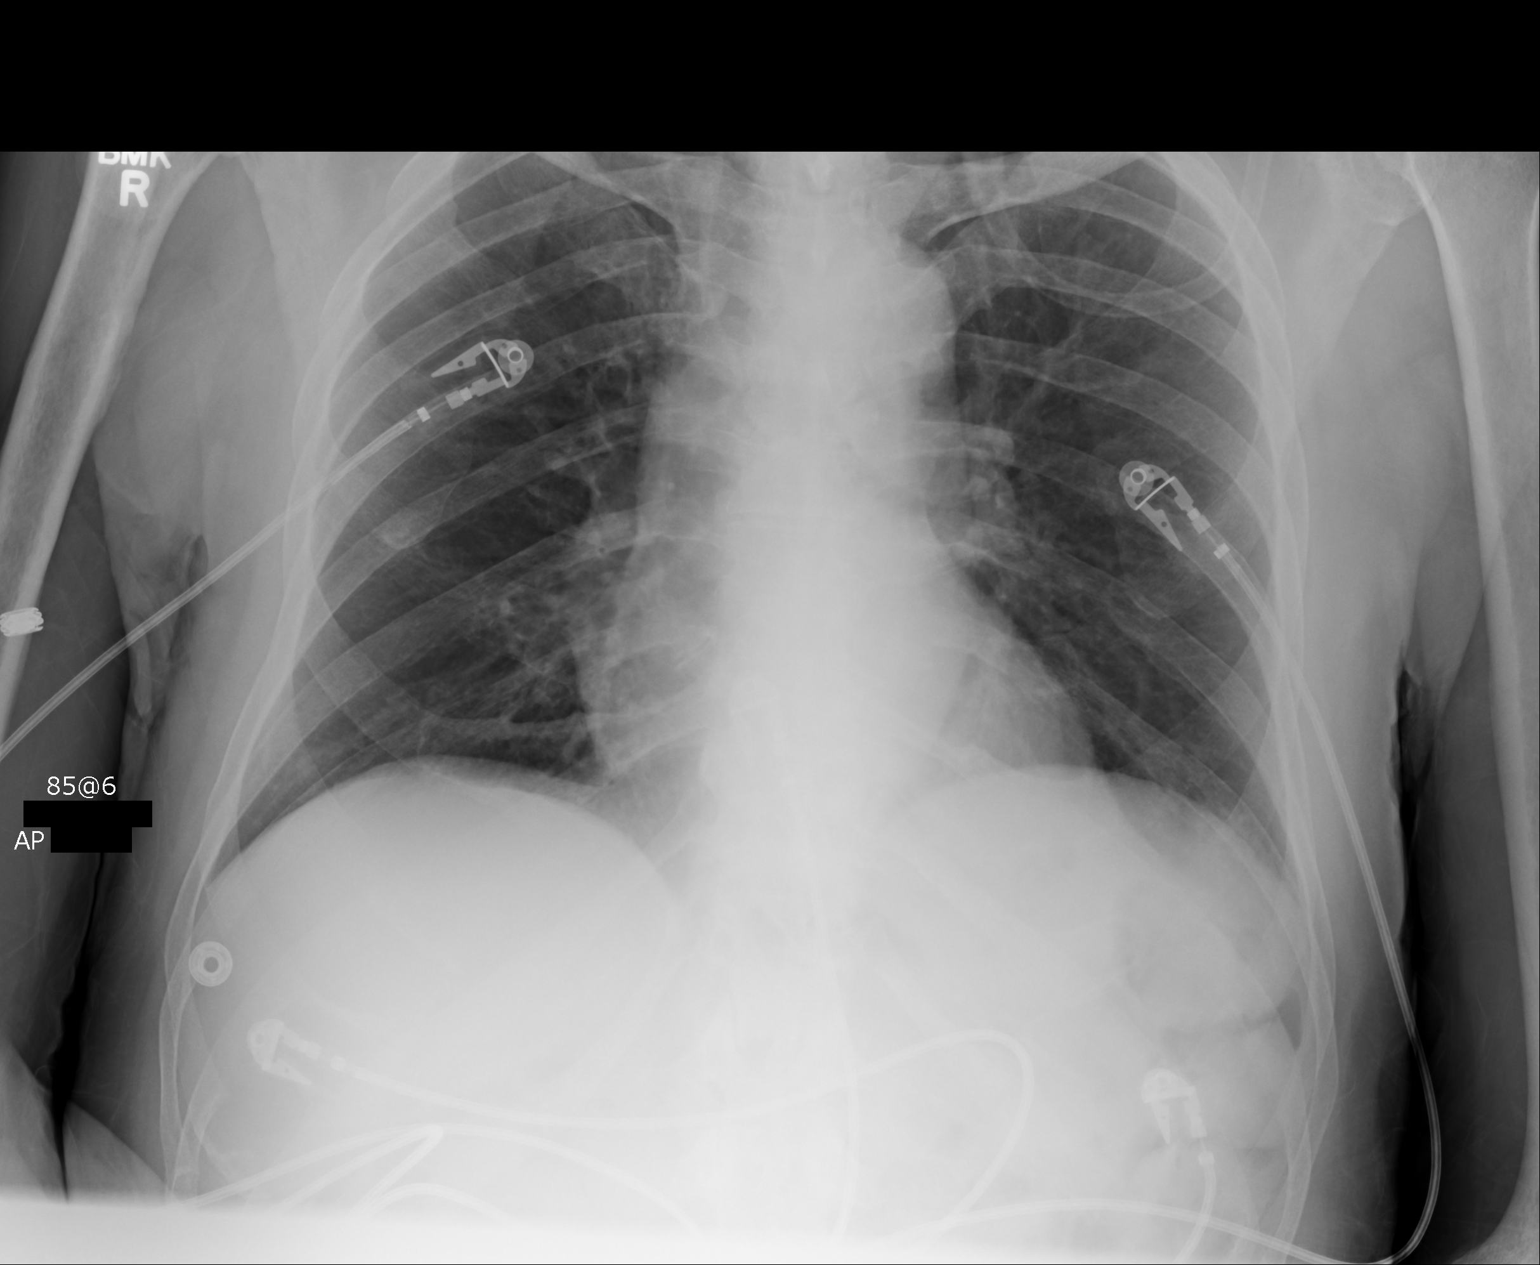

[1 of 1 positions shown; findings below may reference images not displayed]

FINDINGS: Single portable AP chest radiograph is provided.  There is no focal
parenchymal opacity, pleural effusion, or pneumothorax. Normal
cardiomediastinal silhouette. The osseous structures are unremarkable.
IMPRESSION: No acute disease of the che[REDACTED]
